# Patient Record
Sex: Male | Born: 1995 | Race: Black or African American | Hispanic: No | Marital: Single | Smoking: Current every day smoker
Health system: Southern US, Community
[De-identification: ages and names within clinical notes are randomized; demographics above are authoritative.]

---
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
## 2020-01-06 ENCOUNTER — Emergency Department (HOSPITAL_COMMUNITY): Payer: Self-pay

## 2020-01-06 ENCOUNTER — Other Ambulatory Visit: Payer: Self-pay

## 2020-01-06 ENCOUNTER — Emergency Department (HOSPITAL_COMMUNITY)
Admission: EM | Admit: 2020-01-06 | Discharge: 2020-01-06 | Disposition: A | Discharge status: Home / Self Care | Payer: Self-pay | Attending: Emergency Medicine | Admitting: Emergency Medicine

## 2020-01-06 ENCOUNTER — Encounter (HOSPITAL_COMMUNITY): Payer: Self-pay

## 2020-01-06 DIAGNOSIS — Y999 Unspecified external cause status: Secondary | ICD-10-CM | POA: Insufficient documentation

## 2020-01-06 DIAGNOSIS — Z23 Encounter for immunization: Secondary | ICD-10-CM | POA: Insufficient documentation

## 2020-01-06 DIAGNOSIS — W260XXA Contact with knife, initial encounter: Secondary | ICD-10-CM | POA: Insufficient documentation

## 2020-01-06 DIAGNOSIS — S61212A Laceration without foreign body of right middle finger without damage to nail, initial encounter: Secondary | ICD-10-CM | POA: Insufficient documentation

## 2020-01-06 DIAGNOSIS — F17210 Nicotine dependence, cigarettes, uncomplicated: Secondary | ICD-10-CM | POA: Insufficient documentation

## 2020-01-06 DIAGNOSIS — S61411A Laceration without foreign body of right hand, initial encounter: Secondary | ICD-10-CM

## 2020-01-06 DIAGNOSIS — Y92009 Unspecified place in unspecified non-institutional (private) residence as the place of occurrence of the external cause: Secondary | ICD-10-CM | POA: Insufficient documentation

## 2020-01-06 DIAGNOSIS — S31113A Laceration without foreign body of abdominal wall, right lower quadrant without penetration into peritoneal cavity, initial encounter: Secondary | ICD-10-CM | POA: Insufficient documentation

## 2020-01-06 DIAGNOSIS — Y9389 Activity, other specified: Secondary | ICD-10-CM | POA: Insufficient documentation

## 2020-01-06 DIAGNOSIS — S61214A Laceration without foreign body of right ring finger without damage to nail, initial encounter: Secondary | ICD-10-CM | POA: Insufficient documentation

## 2020-01-06 DIAGNOSIS — S31613A Laceration without foreign body of abdominal wall, right lower quadrant with penetration into peritoneal cavity, initial encounter: Secondary | ICD-10-CM

## 2020-01-06 LAB — CBC WITH DIFFERENTIAL/PLATELET
Abs Immature Granulocytes: 0.01 10*3/uL (ref 0.00–0.07)
Basophils Absolute: 0 10*3/uL (ref 0.0–0.1)
Basophils Relative: 0 %
Eosinophils Absolute: 0.1 10*3/uL (ref 0.0–0.5)
Eosinophils Relative: 2 %
HCT: 49.7 % (ref 39.0–52.0)
Hemoglobin: 16.7 g/dL (ref 13.0–17.0)
Immature Granulocytes: 0 %
Lymphocytes Relative: 34 %
Lymphs Abs: 1.7 10*3/uL (ref 0.7–4.0)
MCH: 31.4 pg (ref 26.0–34.0)
MCHC: 33.6 g/dL (ref 30.0–36.0)
MCV: 93.4 fL (ref 80.0–100.0)
Monocytes Absolute: 0.5 10*3/uL (ref 0.1–1.0)
Monocytes Relative: 9 %
Neutro Abs: 2.7 10*3/uL (ref 1.7–7.7)
Neutrophils Relative %: 55 %
Platelets: 255 10*3/uL (ref 150–400)
RBC: 5.32 MIL/uL (ref 4.22–5.81)
RDW: 12.1 % (ref 11.5–15.5)
WBC: 4.9 10*3/uL (ref 4.0–10.5)
nRBC: 0 % (ref 0.0–0.2)

## 2020-01-06 LAB — COMPREHENSIVE METABOLIC PANEL
ALT: 30 U/L (ref 0–44)
AST: 33 U/L (ref 15–41)
Albumin: 4.4 g/dL (ref 3.5–5.0)
Alkaline Phosphatase: 61 U/L (ref 38–126)
Anion gap: 8 (ref 5–15)
BUN: 10 mg/dL (ref 6–20)
CO2: 25 mmol/L (ref 22–32)
Calcium: 8.6 mg/dL — ABNORMAL LOW (ref 8.9–10.3)
Chloride: 102 mmol/L (ref 98–111)
Creatinine, Ser: 0.86 mg/dL (ref 0.61–1.24)
GFR calc Af Amer: >60 mL/min (ref >60)
GFR calc non Af Amer: >60 mL/min (ref >60)
Glucose, Bld: 104 mg/dL — ABNORMAL HIGH (ref 70–99)
Potassium: 5.2 mmol/L — ABNORMAL HIGH (ref 3.5–5.1)
Sodium: 135 mmol/L (ref 135–145)
Total Bilirubin: 0.8 mg/dL (ref 0.3–1.2)
Total Protein: 7.7 g/dL (ref 6.5–8.1)

## 2020-01-06 LAB — LIPASE, BLOOD: Lipase: 28 U/L (ref 11–51)

## 2020-01-06 MED ORDER — IBUPROFEN 200 MG PO TABS
600.0000 mg | ORAL_TABLET | Freq: Once | ORAL | Status: AC
  Administered 2020-01-06: 600 mg via ORAL
  Filled 2020-01-06: qty 3

## 2020-01-06 MED ORDER — SODIUM CHLORIDE (PF) 0.9 % IJ SOLN
INTRAMUSCULAR | Status: AC
  Filled 2020-01-06: qty 50

## 2020-01-06 MED ORDER — LIDOCAINE HCL 2 % IJ SOLN
10.0000 mL | Freq: Once | INTRAMUSCULAR | Status: DC
  Filled 2020-01-06: qty 20

## 2020-01-06 MED ORDER — IOHEXOL 300 MG/ML  SOLN
100.0000 mL | Freq: Once | INTRAMUSCULAR | Status: AC | PRN
  Administered 2020-01-06: 100 mL via INTRAVENOUS

## 2020-01-06 MED ORDER — TETANUS-DIPHTH-ACELL PERTUSSIS 5-2.5-18.5 LF-MCG/0.5 IM SUSP
0.5000 mL | Freq: Once | INTRAMUSCULAR | Status: AC
Start: 2020-01-06 — End: 2020-01-06
  Administered 2020-01-06: 0.5 mL via INTRAMUSCULAR
  Filled 2020-01-06: qty 0.5

## 2020-01-06 NOTE — Progress Notes (Signed)
Orthopedic Tech Progress Note Patient Details:  Eric Haney 29-Sep-1995 [id number]  Ortho Devices Type of Ortho Device: Finger splint Ortho Device/Splint Location: right Ortho Device/Splint Interventions: Application   Post Interventions Patient Tolerated: Well Instructions Provided: Care of device   Saul Fordyce 01/06/2020, 1:45 PM

## 2020-01-06 NOTE — ED Triage Notes (Signed)
Patient states he was playing with his dog this morning and had an open knife in his hand. Patient states the knife dropped on the floor.  Patient has a cut to the abdomen and right middle and ring finger. Bleeding controlled.

## 2020-01-06 NOTE — Discharge Instructions (Addendum)
Please follow-up for suture removal at either urgent care ,the emergency department, or your primary care doctor in 7-10 days.  Please return to the emergency room immediately if you experience any new or worsening symptoms or any symptoms that indicate worsening infection such as fevers, increased redness/swelling/pain, warmth, or drainage from the affected area.   

## 2020-01-06 NOTE — ED Provider Notes (Signed)
[facility] DEPT Provider Note   CSN: [id number] Arrival date & time: 01/06/20  8299     History Chief Complaint  Patient presents with  . Laceration    Eric Haney is a 24 y.o. male.  HPI   Patient is a 24 year old male who presents to the emergency department today for evaluation of multiple lacerations.  States he was playing with his dog when he fell and landed on a knife.  States the blade of the knife was about 6 inches long.  He has lacerations to his right forearm, right hand and to his abdomen.  He is not sure how deep the knife went into his abdomen.  He describes minimal pain to the abdomen but has more severe pain to the right hand.  Denies any sensory changes to the right hand.  He is not sure when his last tetanus shot was.  History reviewed. No pertinent past medical history.  There are no problems to display for this patient.   History reviewed. No pertinent surgical history.     History reviewed. No pertinent family history.  Social History   Tobacco Use  . Smoking status: Current Every Day Smoker    Packs/day: 0.25    Types: Cigarettes  . Smokeless tobacco: Never Used  Vaping Use  . Vaping Use: Never used  Substance Use Topics  . Alcohol use: Yes  . Drug use: Never    Home Medications Prior to Admission medications   Not on File    Allergies    Patient has no known allergies.  Review of Systems   Review of Systems  Constitutional: Negative for fever.  HENT: Negative for ear pain.   Eyes: Negative for visual disturbance.  Respiratory: Negative for shortness of breath.   Cardiovascular: Negative for chest pain.  Gastrointestinal: Positive for abdominal pain. Negative for nausea and vomiting.  Genitourinary: Negative for flank pain.  Musculoskeletal:       Right hand pain  Skin: Positive for wound.  Neurological: Negative for weakness, numbness and headaches.  All other systems reviewed and are  negative.   Physical Exam Updated Vital Signs BP (!) 144/90 (BP Location: Left Arm)   Pulse 83   Temp 98 F (36.7 C) (Oral)   Resp 18   Ht 5\' 11"  (1.803 m)   Wt 132.5 kg   SpO2 96%   BMI 40.73 kg/m   Physical Exam Constitutional:      General: He is not in acute distress.    Appearance: He is well-developed.  Eyes:     Conjunctiva/sclera: Conjunctivae normal.  Cardiovascular:     Rate and Rhythm: Normal rate and regular rhythm.  Pulmonary:     Effort: Pulmonary effort is normal.     Breath sounds: Normal breath sounds.  Abdominal:     Palpations: Abdomen is soft.     Comments: 2cm laceration to the abdomen with fatty tissue visualized. Base of wound unable to be visualized. Mild TTP noted to the left mid abdomen. Mild amount of bleeding noted.   Skin:    General: Skin is warm and dry.     Comments: Two 1.5 cm linear lacerations over the dorsum of the right hand. Distal sensation and perfusion intact. Strength intact with flexion/extension of each of the fingers. 3cm superficial laceration to the right forearm  Neurological:     Mental Status: He is alert and oriented to person, place, and time.  ED Results / Procedures / Treatments   Labs (all labs ordered are listed, but only abnormal results are displayed) Labs Reviewed  COMPREHENSIVE METABOLIC PANEL - Abnormal; Notable for the following components:      Result Value   Potassium 5.2 (*)    Glucose, Bld 104 (*)    Calcium 8.6 (*)    All other components within normal limits  CBC WITH DIFFERENTIAL/PLATELET  LIPASE, BLOOD    EKG None  Radiology CT ABDOMEN PELVIS W CONTRAST  Result Date: 01/06/2020 CLINICAL DATA:  Abdominal trauma. Cut abdomen with knife after falling while holding an open blade. EXAM: CT ABDOMEN AND PELVIS WITH CONTRAST TECHNIQUE: Multidetector CT imaging of the abdomen and pelvis was performed using the standard protocol following bolus administration of intravenous contrast.  CONTRAST:  OMNIPAQUE IOHEXOL 300 MG/ML  SOLN COMPARISON:  None. FINDINGS: Lower chest: No acute abnormality. Hepatobiliary: No focal liver abnormality is seen. No gallstones, gallbladder wall thickening, or biliary dilatation. Pancreas: Unremarkable. No pancreatic ductal dilatation or surrounding inflammatory changes. Spleen: Normal in size without focal abnormality. Adrenals/Urinary Tract: Normal appearance of the adrenal glands. The kidneys are unremarkable. No mass or hydronephrosis. Urinary bladder is unremarkable. Stomach/Bowel: Stomach appears normal. No bowel wall thickening, inflammation or distension. The appendix is visualized and appears normal. Vascular/Lymphatic: No significant vascular findings are present. No enlarged abdominal or pelvic lymph nodes. Reproductive: Prostate is unremarkable. Other: There is no free fluid or fluid collections within the abdomen or pelvis. No pneumoperitoneum identified. Musculoskeletal: No acute or significant osseous findings. Bilateral L5 pars defects noted. No significant anterolisthesis. IMPRESSION: 1. No acute findings identified within the abdomen or pelvis. 2. Ventral abdominal wall skin defect compatible with laceration site. No significant hematoma 3. Bilateral L5 pars defects. Electronically Signed   By: Signa Kell M.D.   On: 01/06/2020 12:31   DG Hand Complete Right  Result Date: 01/06/2020 CLINICAL DATA:  Right third finger pain after injury playing with dog this morning EXAM: RIGHT HAND - COMPLETE 3+ VIEW COMPARISON:  None. FINDINGS: No fracture or dislocation. No radiopaque foreign bodies. No focal osseous lesions or significant arthropathy. IMPRESSION: No fracture or malalignment.  No radiopaque foreign body. Electronically Signed   By: Delbert Phenix M.D.   On: 01/06/2020 11:14    Procedures .Marland KitchenLaceration Repair  Date/Time: 01/06/2020 1:30 PM Performed by: Karrie Meres, PA-C Authorized by: Karrie Meres, PA-C   Consent:     Consent obtained:  Verbal   Consent given by:  Patient   Risks discussed:  Pain, infection and need for additional repair   Alternatives discussed:  No treatment Anesthesia (see MAR for exact dosages):    Anesthesia method:  Nerve block   Block location:  3rd digit   Block needle gauge:  27 G   Block anesthetic:  Lidocaine 2% w/o epi   Block injection procedure:  Anatomic landmarks identified, introduced needle, incremental injection, negative aspiration for blood and anatomic landmarks palpated   Block outcome:  Anesthesia achieved Laceration details:    Location:  Finger   Finger location:  R long finger   Length (cm):  1.5 Repair type:    Repair type:  Simple Pre-procedure details:    Preparation:  Patient was prepped and draped in usual sterile fashion and imaging obtained to evaluate for foreign bodies Exploration:    Hemostasis achieved with:  Direct pressure   Wound exploration: wound explored through full range of motion and entire depth of wound probed and visualized  Wound extent: no foreign bodies/material noted, no nerve damage noted, no tendon damage noted, no underlying fracture noted and no vascular damage noted     Contaminated: no   Treatment:    Area cleansed with:  Betadine and saline   Amount of cleaning:  Extensive   Irrigation solution:  Sterile saline   Irrigation volume:  500cc   Irrigation method:  Pressure wash   Visualized foreign bodies/material removed: no   Skin repair:    Repair method:  Sutures   Suture size:  5-0   Suture material:  Prolene   Suture technique:  Simple interrupted   Number of sutures:  5 Approximation:    Approximation:  Close Post-procedure details:    Dressing:  Splint for protection   Patient tolerance of procedure:  Tolerated well, no immediate complications .Marland KitchenLaceration Repair  Date/Time: 01/06/2020 1:32 PM Performed by: Karrie Meres, PA-C Authorized by: Karrie Meres, PA-C   Consent:    Consent  obtained:  Verbal   Consent given by:  Patient   Risks discussed:  Infection, pain and need for additional repair   Alternatives discussed:  No treatment Anesthesia (see MAR for exact dosages):    Anesthesia method:  Nerve block   Block needle gauge:  27 G   Block anesthetic:  Lidocaine 2% w/o epi   Block injection procedure:  Anatomic landmarks identified, introduced needle, incremental injection, negative aspiration for blood and anatomic landmarks palpated   Block outcome:  Anesthesia achieved Laceration details:    Location:  Finger   Finger location:  R ring finger   Length (cm):  1.5 Repair type:    Repair type:  Simple Pre-procedure details:    Preparation:  Patient was prepped and draped in usual sterile fashion and imaging obtained to evaluate for foreign bodies Exploration:    Hemostasis achieved with:  Direct pressure   Wound exploration: wound explored through full range of motion and entire depth of wound probed and visualized     Wound extent: no foreign bodies/material noted, no nerve damage noted, no tendon damage noted, no underlying fracture noted and no vascular damage noted     Contaminated: no   Treatment:    Area cleansed with:  Betadine and saline   Amount of cleaning:  Extensive   Irrigation solution:  Sterile saline   Irrigation volume:  500cc   Irrigation method:  Pressure wash   Visualized foreign bodies/material removed: no   Skin repair:    Repair method:  Sutures   Suture size:  5-0   Suture material:  Prolene   Suture technique:  Simple interrupted   Number of sutures:  3 Approximation:    Approximation:  Close Post-procedure details:    Dressing:  Splint for protection   Patient tolerance of procedure:  Tolerated well, no immediate complications .Marland KitchenLaceration Repair  Date/Time: 01/06/2020 1:32 PM Performed by: Karrie Meres, PA-C Authorized by: Karrie Meres, PA-C   Consent:    Consent obtained:  Verbal   Consent given by:   Patient   Risks discussed:  Infection, pain and need for additional repair   Alternatives discussed:  No treatment Anesthesia (see MAR for exact dosages):    Anesthesia method:  Local infiltration   Local anesthetic:  Lidocaine 2% w/o epi Laceration details:    Location:  Trunk   Trunk location:  RLQ abd   Length (cm):  2 Repair type:    Repair type:  Simple Pre-procedure details:  Preparation:  Patient was prepped and draped in usual sterile fashion and imaging obtained to evaluate for foreign bodies Exploration:    Hemostasis achieved with:  Direct pressure   Wound exploration: wound explored through full range of motion and entire depth of wound probed and visualized     Wound extent: no fascia violation noted, no foreign bodies/material noted, no nerve damage noted, no tendon damage noted, no underlying fracture noted and no vascular damage noted     Contaminated: no   Treatment:    Area cleansed with:  Betadine and saline   Amount of cleaning:  Extensive   Irrigation solution:  Sterile saline   Irrigation volume:  500cc   Irrigation method:  Pressure wash   Visualized foreign bodies/material removed: no   Skin repair:    Repair method:  Sutures   Suture size:  4-0   Suture material:  Prolene   Suture technique:  Simple interrupted   Number of sutures:  6 Approximation:    Approximation:  Close Post-procedure details:    Dressing:  Non-adherent dressing   Patient tolerance of procedure:  Tolerated well, no immediate complications   (including critical care time)  Medications Ordered in ED Medications  lidocaine (XYLOCAINE) 2 % (with pres) injection 200 mg (has no administration in time range)  sodium chloride (PF) 0.9 % injection (has no administration in time range)  ibuprofen (ADVIL) tablet 600 mg (600 mg Oral Given 01/06/20 1157)  Tdap (BOOSTRIX) injection 0.5 mL (0.5 mLs Intramuscular Given 01/06/20 1156)  iohexol (OMNIPAQUE) 300 MG/ML solution 100 mL (100 mLs  Intravenous Contrast Given 01/06/20 1143)    ED Course  I have reviewed the triage vital signs and the nursing notes.  Pertinent labs & imaging results that were available during my care of the patient were reviewed by me and considered in my medical decision making (see chart for details).    MDM Rules/Calculators/A&P                         24 y/o male presenting with puncture wound to the abdomen and lacerations to the hand and right forearm  CBC nonacute CMP with mildly elevated k, otherwise reasuring Lipase normal  Xray right hand neg for fx  CT abd/pelvis w/o acute traumatic injury within the abd/pelvis  Pressure irrigation performed. Wound explored and base of wound visualized in a bloodless field without evidence of foreign body.  Laceration occurred < 8 hours prior to repair which was well tolerated.  Tdap updated.  Pt has  no comorbidities to effect normal wound healing. Pt discharged  without antibiotics.  Discussed suture home care with patient and answered questions. Pt to follow-up for wound check and suture removal in 10-14 days; they are to return to the ED sooner for signs of infection. Pt is hemodynamically stable with no complaints prior to dc.    Final Clinical Impression(s) / ED Diagnoses Final diagnoses:  Laceration of right hand, foreign body presence unspecified, initial encounter  Lacerat of RLQ of abdomin wall w/o FB w/penentrat into periton cavity, initial encounter    Rx / DC Orders ED Discharge Orders    None       Rayne Du 01/06/20 1334    Linwood Dibbles, MD 01/07/20 [phone]

## 2020-01-20 ENCOUNTER — Other Ambulatory Visit: Payer: Self-pay

## 2020-01-20 ENCOUNTER — Encounter (HOSPITAL_COMMUNITY): Payer: Self-pay | Admitting: Emergency Medicine

## 2020-01-20 ENCOUNTER — Emergency Department (HOSPITAL_COMMUNITY)
Admission: EM | Admit: 2020-01-20 | Discharge: 2020-01-20 | Disposition: A | Discharge status: Home / Self Care | Payer: Self-pay | Attending: Emergency Medicine | Admitting: Emergency Medicine

## 2020-01-20 DIAGNOSIS — Z4802 Encounter for removal of sutures: Secondary | ICD-10-CM | POA: Insufficient documentation

## 2020-01-20 NOTE — ED Triage Notes (Signed)
Per pt, states needs stitches removed from right pointer and ring finger

## 2020-01-20 NOTE — Discharge Instructions (Signed)
Please follow up with your primary care provider within 5-7 days for re-evaluation of your symptoms. If you do not have a primary care provider, information for a healthcare clinic has been provided for you to make arrangements for follow up care. Please return to the emergency department for any new or worsening symptoms. ° °

## 2020-01-20 NOTE — ED Provider Notes (Signed)
[facility]-EMERGENCY DEPT Provider Note   CSN: [id number] Arrival date & time: 01/20/20  1302     History Chief Complaint  Patient presents with  . Suture / Staple Removal    Eric Haney is a 24 y.o. male.  HPI   24 year old male presenting for evaluation for suture removal.  He had sutures placed on 01/06/2020 to the right third and fourth digit in the abdomen.  States wounds are healing well.  No fevers, increased redness, pain or swelling to the wound  History reviewed. No pertinent past medical history.  There are no problems to display for this patient.   History reviewed. No pertinent surgical history.     No family history on file.  Social History   Tobacco Use  . Smoking status: Current Every Day Smoker    Packs/day: 0.25    Types: Cigarettes  . Smokeless tobacco: Never Used  Vaping Use  . Vaping Use: Never used  Substance Use Topics  . Alcohol use: Yes  . Drug use: Never    Home Medications Prior to Admission medications   Not on File    Allergies    Patient has no known allergies.  Review of Systems   Review of Systems  Constitutional: Negative for fever.  Respiratory: Negative for shortness of breath.   Cardiovascular: Negative for chest pain.  Skin: Positive for wound.    Physical Exam Updated Vital Signs BP (!) 148/94 (BP Location: Right Arm)   Pulse 78   Temp 99.5 F (37.5 C) (Oral)   Resp 18   SpO2 93%   Physical Exam Constitutional:      General: He is not in acute distress.    Appearance: He is well-developed.  Eyes:     Conjunctiva/sclera: Conjunctivae normal.  Cardiovascular:     Rate and Rhythm: Normal rate.  Pulmonary:     Effort: Pulmonary effort is normal.  Musculoskeletal:     Comments: Sutures in place to the third and fourth digits as well as to the abdomen.  Wounds are well-healing without erythema, warmth, induration.  No wound dehiscence.  Skin:    General: Skin is warm and dry.    Neurological:     Mental Status: He is alert and oriented to person, place, and time.     ED Results / Procedures / Treatments   Labs (all labs ordered are listed, but only abnormal results are displayed) Labs Reviewed - No data to display  EKG None  Radiology No results found.  Procedures .Suture Removal  Date/Time: 01/20/2020 1:32 PM Performed by: Karrie Meres, PA-C Authorized by: Karrie Meres, PA-C   Consent:    Consent obtained:  Verbal   Consent given by:  Patient   Risks discussed:  Bleeding   Alternatives discussed:  No treatment Location:    Location:  Upper extremity   Upper extremity location:  Hand   Hand location:  R long finger Procedure details:    Wound appearance:  No signs of infection, nonpurulent and good wound healing   Number of sutures removed:  5 Post-procedure details:    Post-removal:  No dressing applied   Patient tolerance of procedure:  Tolerated well, no immediate complications .Suture Removal  Date/Time: 01/20/2020 1:33 PM Performed by: Karrie Meres, PA-C Authorized by: Karrie Meres, PA-C   Consent:    Consent obtained:  Verbal   Consent given by:  Patient   Risks discussed:  Bleeding and pain  Alternatives discussed:  No treatment Location:    Location:  Upper extremity   Upper extremity location:  Hand   Hand location:  R ring finger Procedure details:    Wound appearance:  No signs of infection and good wound healing   Number of sutures removed:  3 Post-procedure details:    Post-removal:  No dressing applied   Patient tolerance of procedure:  Tolerated well, no immediate complications .Suture Removal  Date/Time: 01/20/2020 1:33 PM Performed by: Karrie Meres, PA-C Authorized by: Karrie Meres, PA-C   Consent:    Consent obtained:  Verbal   Consent given by:  Patient   Risks discussed:  Bleeding and pain   Alternatives discussed:  No treatment Location:    Location:  Trunk   Trunk  location:  Abdomen Procedure details:    Wound appearance:  No signs of infection, good wound healing and clean   Number of sutures removed:  6 Post-procedure details:    Post-removal:  No dressing applied   Patient tolerance of procedure:  Tolerated well, no immediate complications   (including critical care time)  Medications Ordered in ED Medications - No data to display  ED Course  I have reviewed the triage vital signs and the nursing notes.  Pertinent labs & imaging results that were available during my care of the patient were reviewed by me and considered in my medical decision making (see chart for details).    MDM Rules/Calculators/A&P                          Staple removal   Pt to ER for staple/suture removal and wound check as above. Procedure tolerated well. Vitals normal, no signs of infection. Scar minimization & return precautions given at dc.    Final Clinical Impression(s) / ED Diagnoses Final diagnoses:  Visit for suture removal    Rx / DC Orders ED Discharge Orders    None       Rayne Du 01/20/20 1334    Arby Barrette, MD 01/21/20 1032

## 2021-03-25 IMAGING — DX DG HAND COMPLETE 3+V*R*
3 series · 3 of 3 positions shown · non-contrast
Comparison: None.

CLINICAL DATA: Right third finger pain after injury playing with
dog this morning

EXAM:
RIGHT HAND - COMPLETE 3+ VIEW

[hand ap]
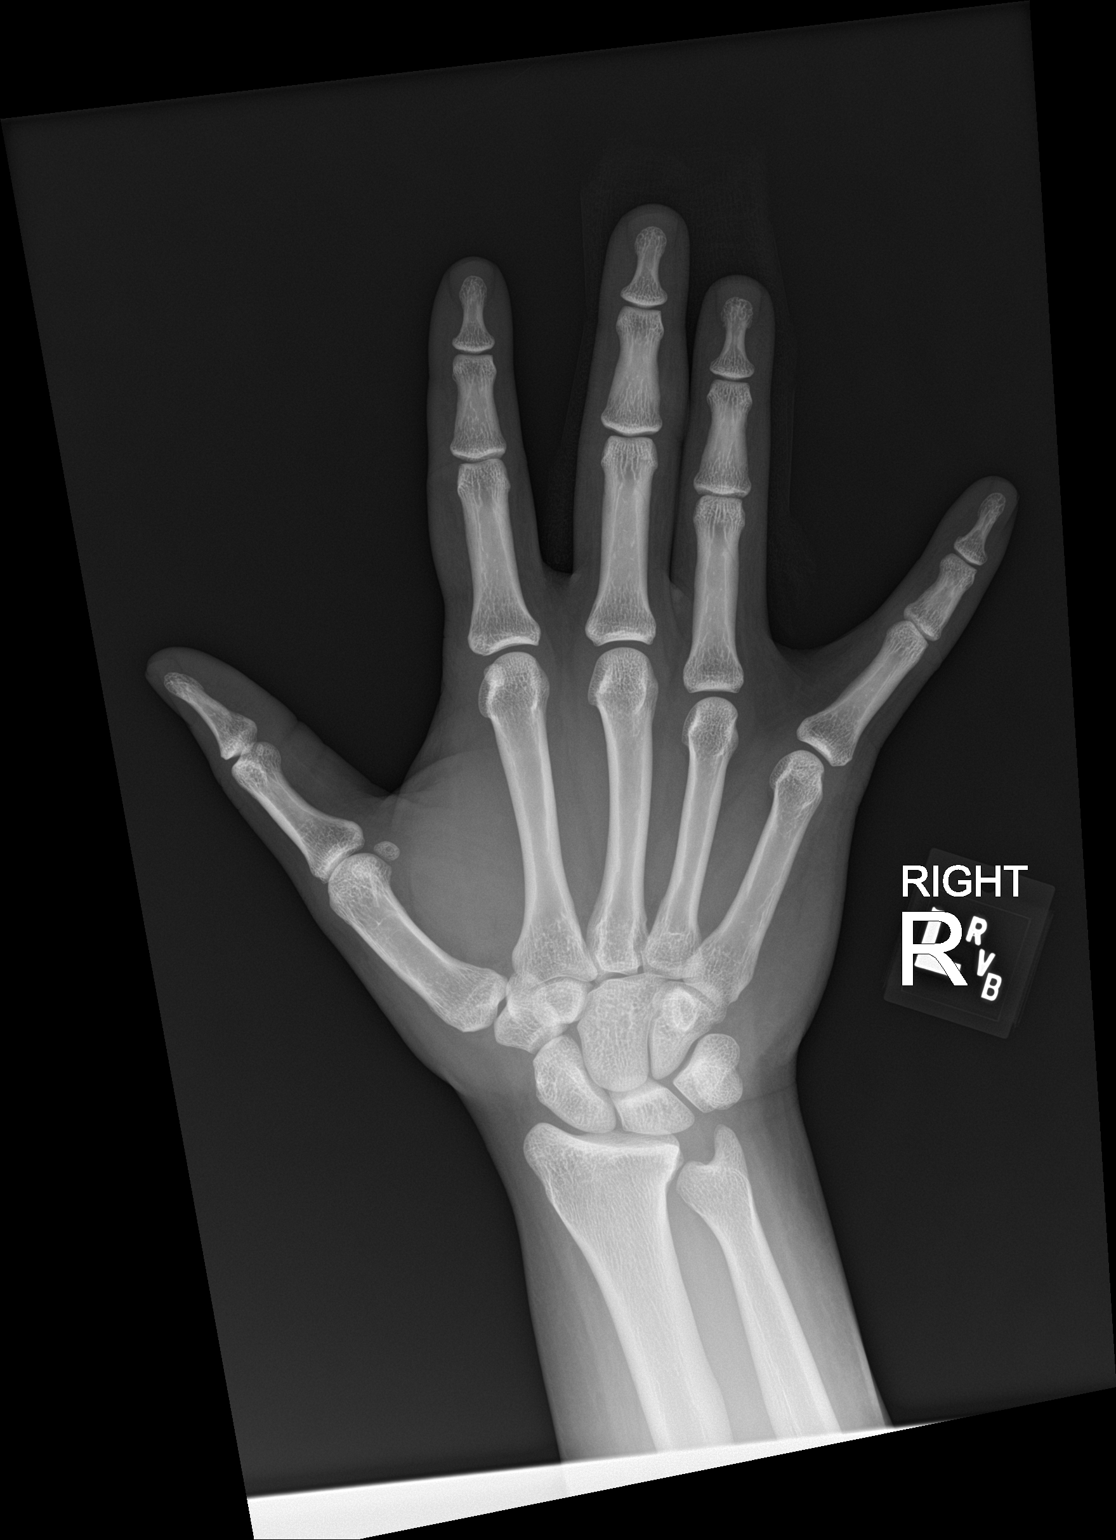

[hand obl]
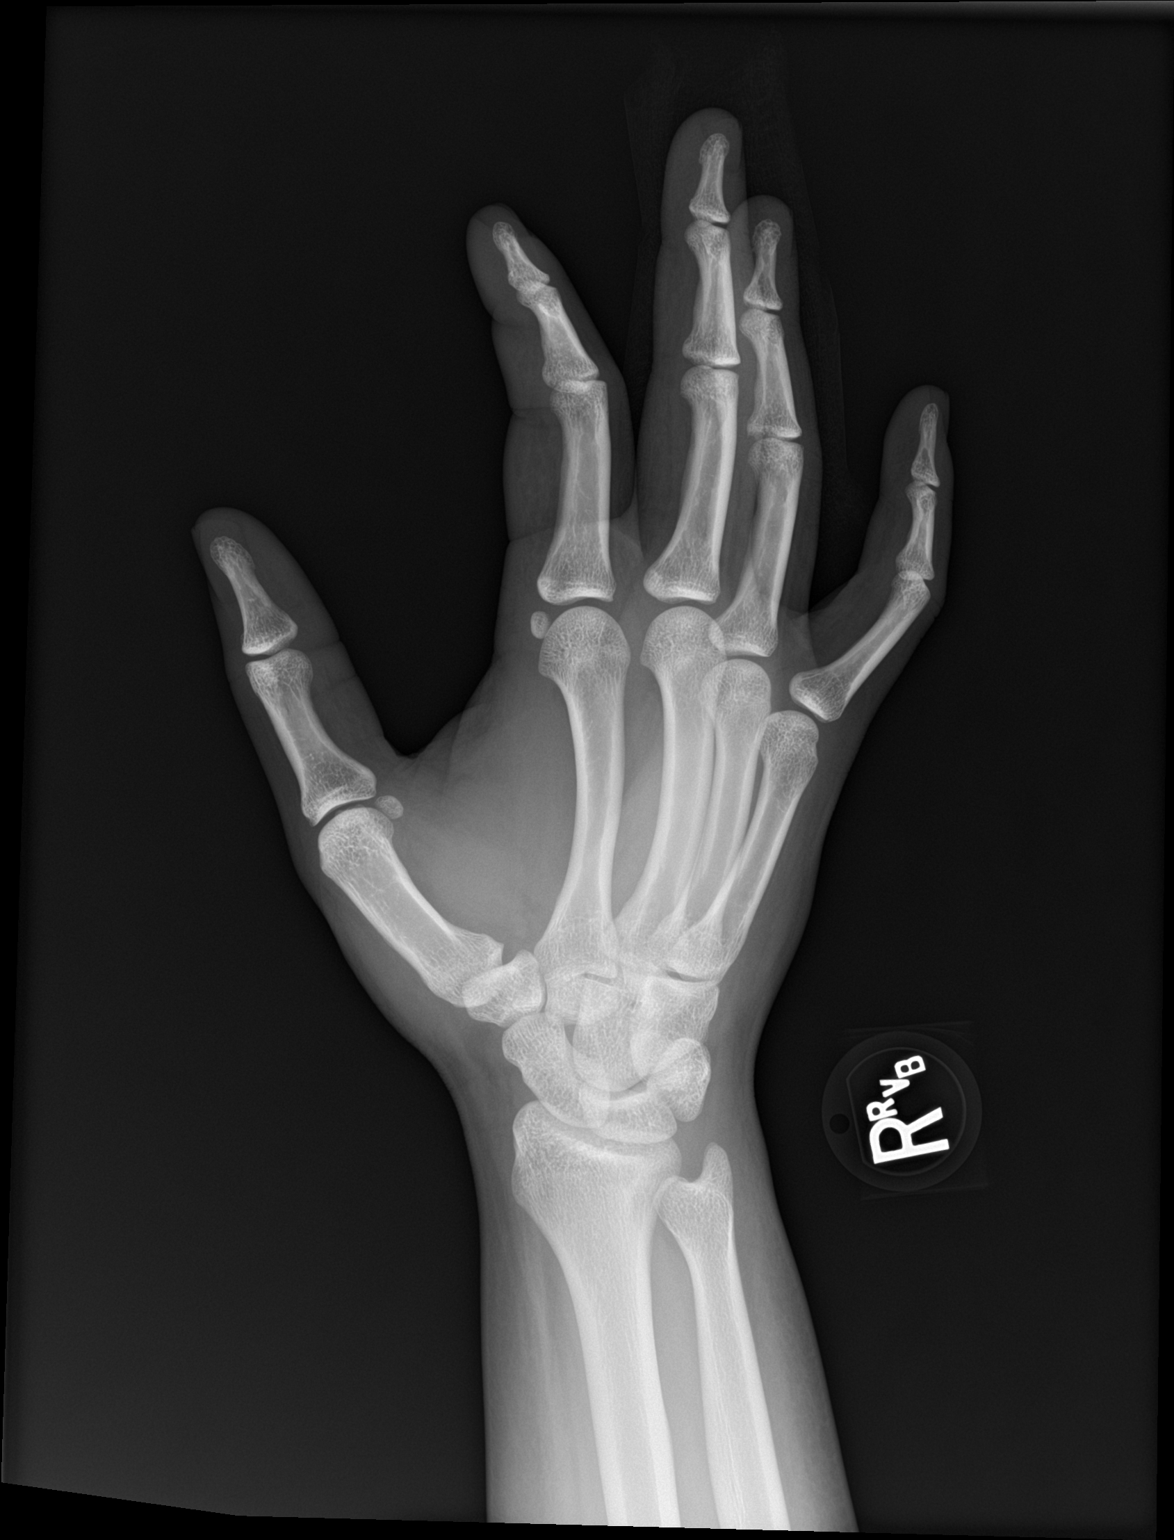

[hand lat]
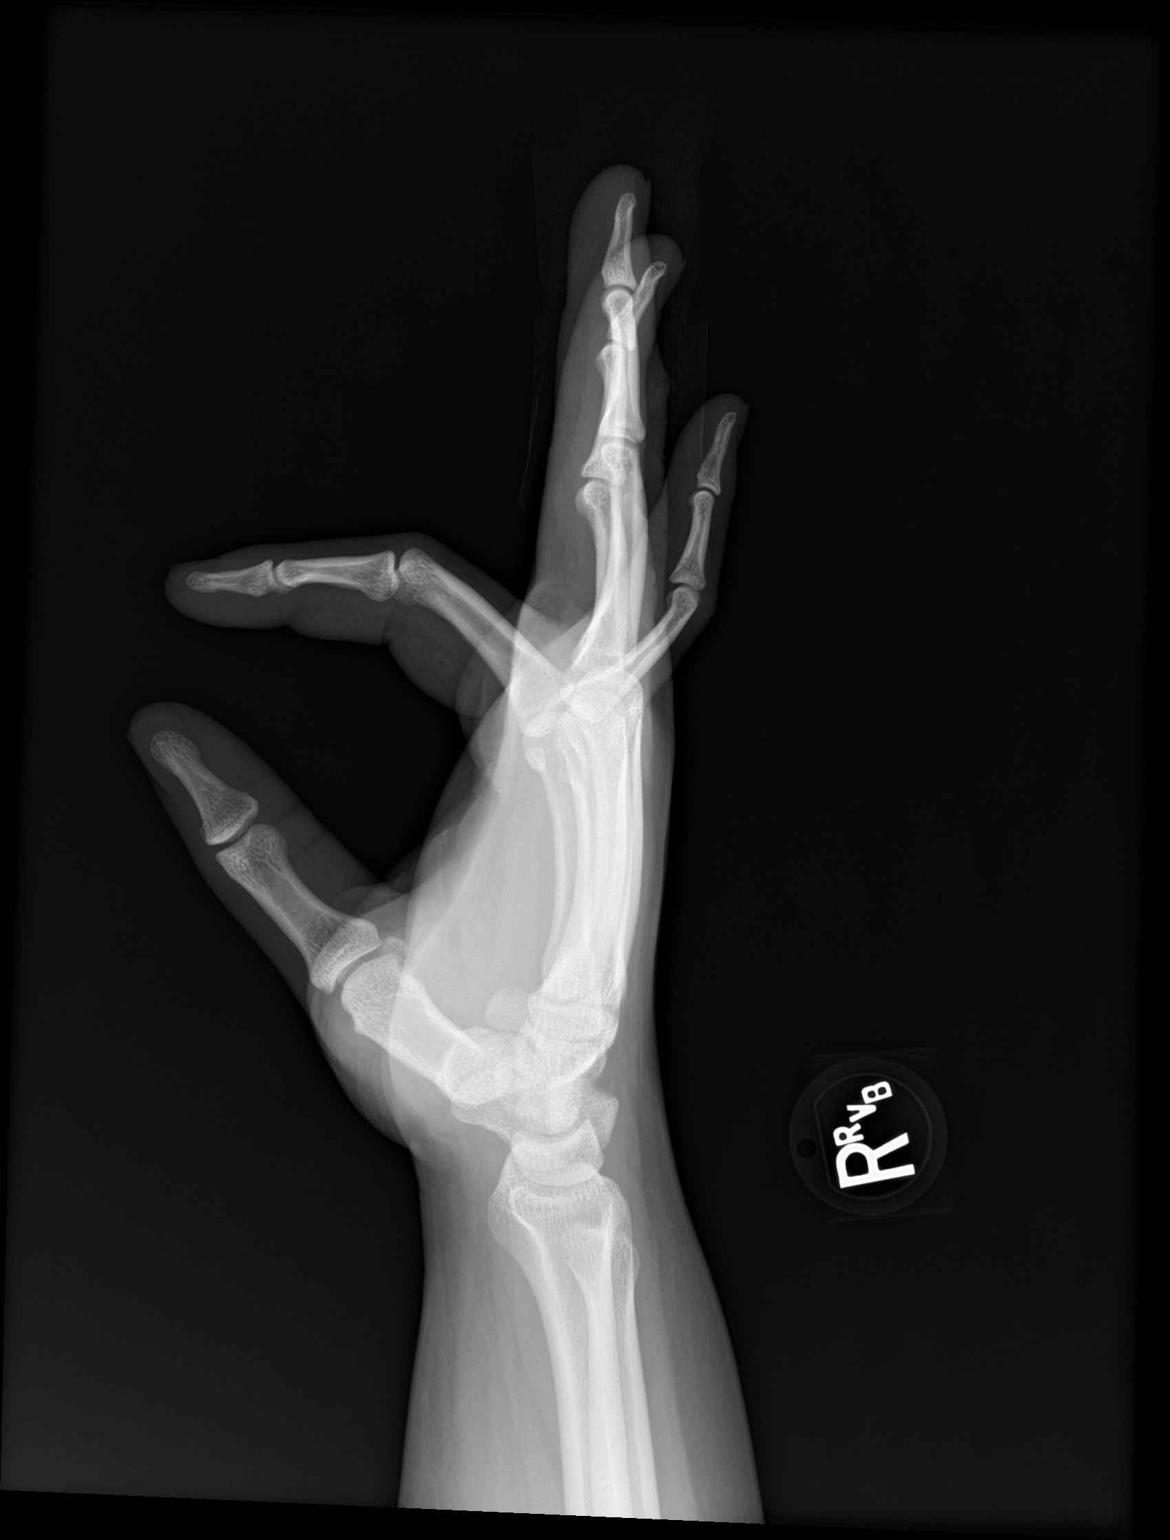

[3 of 3 positions shown; findings below may reference images not displayed]

FINDINGS: No fracture or dislocation. No radiopaque foreign bodies. No focal
osseous lesions or significant arthropathy.
IMPRESSION: No fracture or malalignment.  No radiopaque foreign body.

## 2021-03-25 IMAGING — CT CT ABD-PELV W/ CM
2 of 7 series · 14 of 46 positions shown, 18 images · IV contrast (OMNIPAQUE 300)
Comparison: None.

CLINICAL DATA: Abdominal trauma. Cut abdomen with knife after
falling while holding an open blade.

EXAM:
CT ABDOMEN AND PELVIS WITH CONTRAST
TECHNIQUE: Multidetector CT imaging of the abdomen and pelvis was performed
using the standard protocol following bolus administration of
intravenous contrast.
CONTRAST:  100mL OMNIPAQUE IOHEXOL 300 MG/ML  SOLN

[Series 2: axial st · axial · 0.98mm/px · z∈[-489,-94]mm · 11 of 91 slices shown, 15 images]
[im 6/91  soft-tissue]
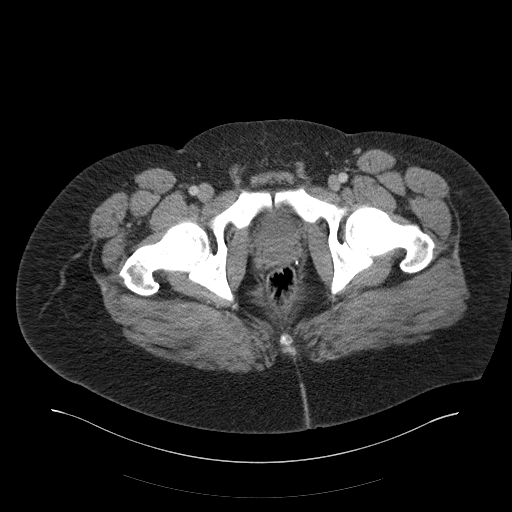
[im 6/91  bone]
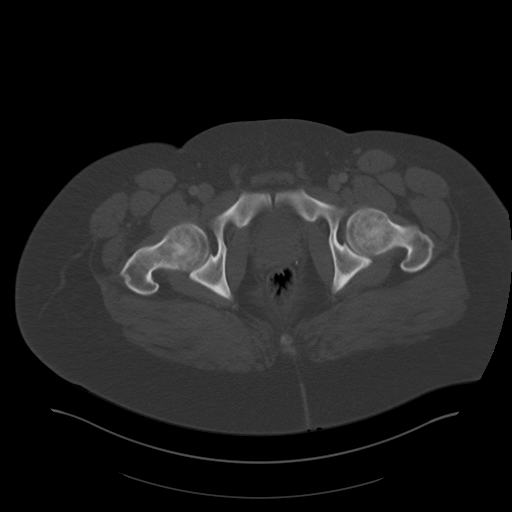
[im 17/91  soft-tissue]
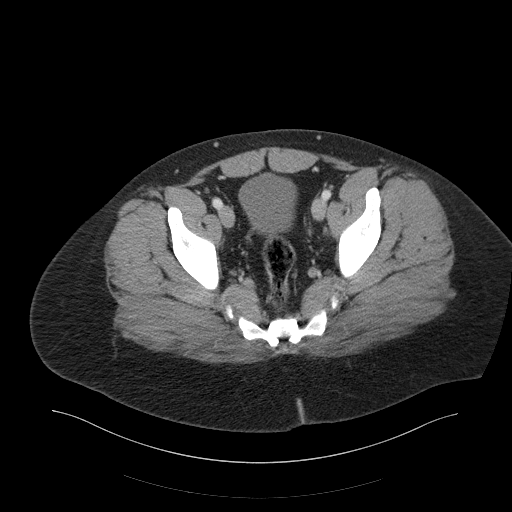
[im 29/91  soft-tissue]
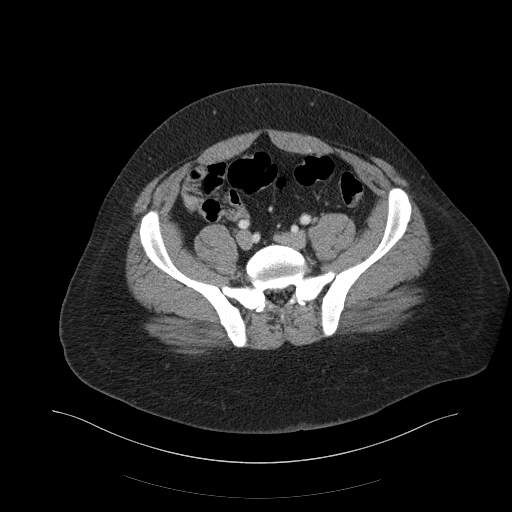
[im 34/91  soft-tissue]
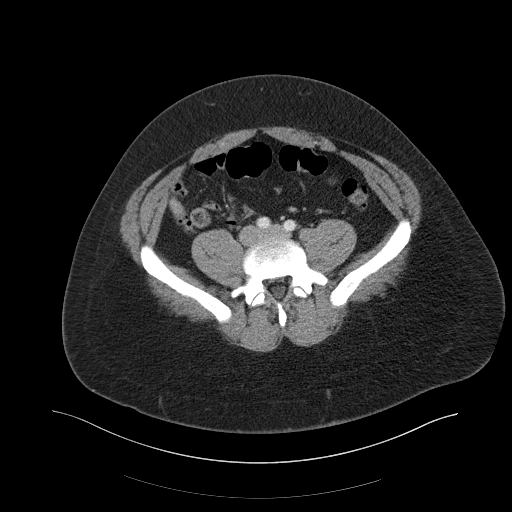
[im 46/91  soft-tissue]
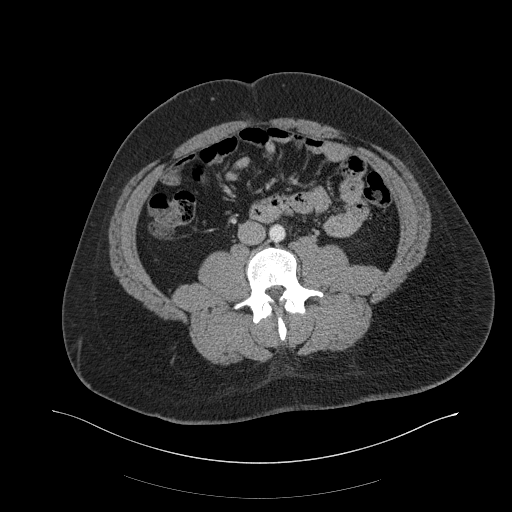
[im 57/91  soft-tissue]
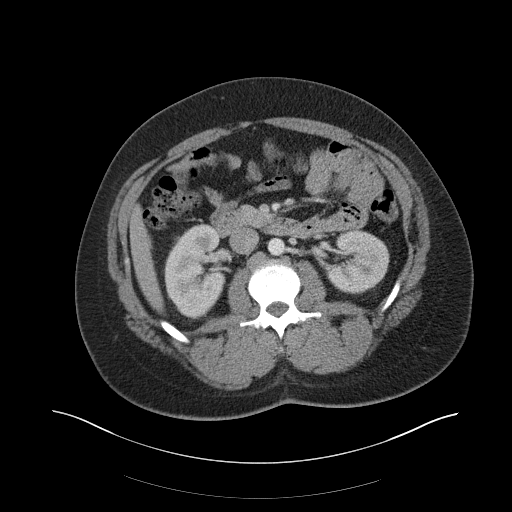
[im 62/91  soft-tissue]
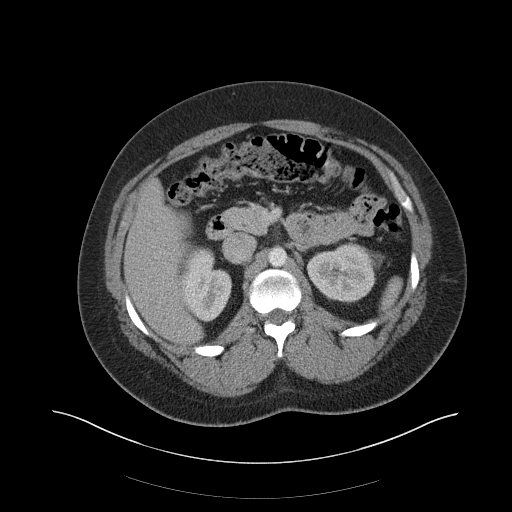
[im 68/91  lung]
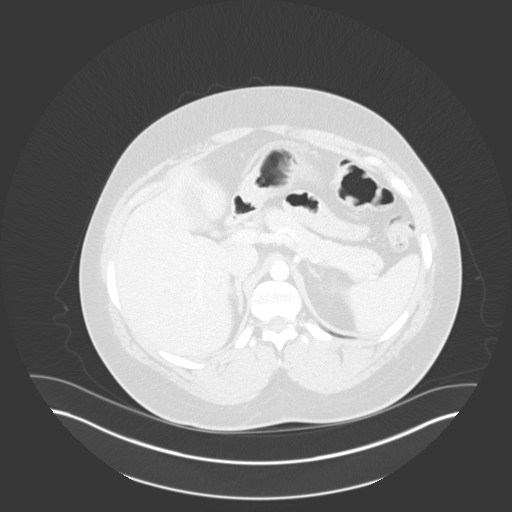
[im 74/91  soft-tissue]
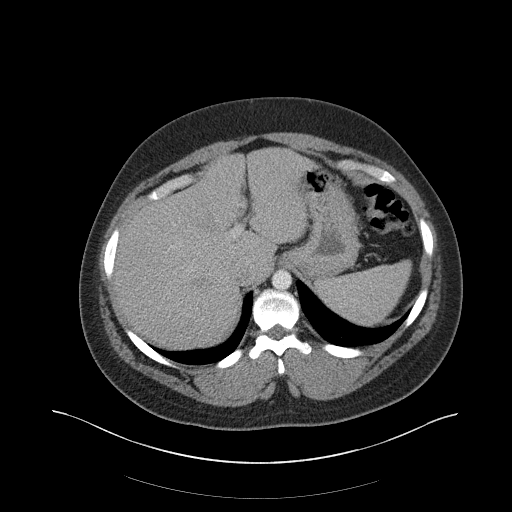
[im 74/91  lung]
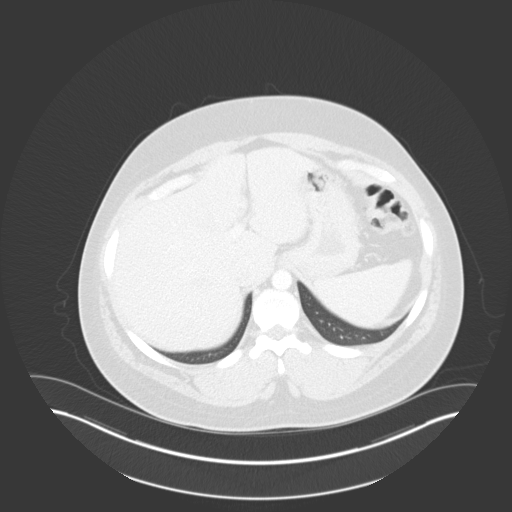
[im 79/91  lung]
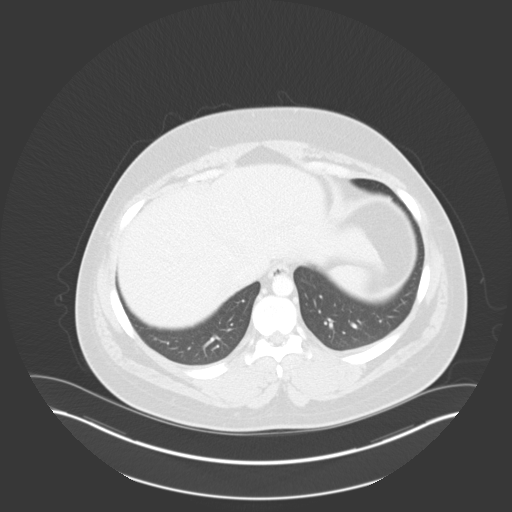
[im 85/91  soft-tissue]
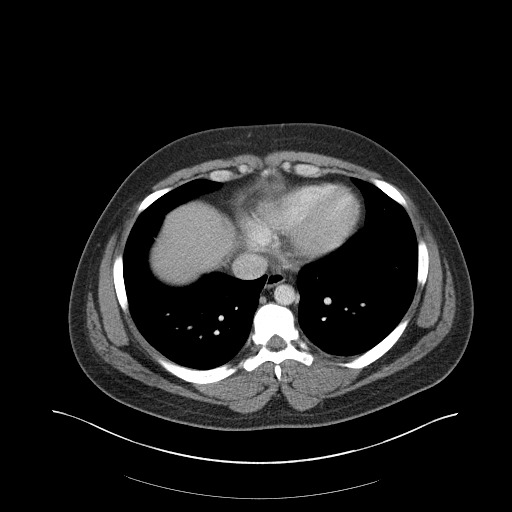
[im 85/91  lung]
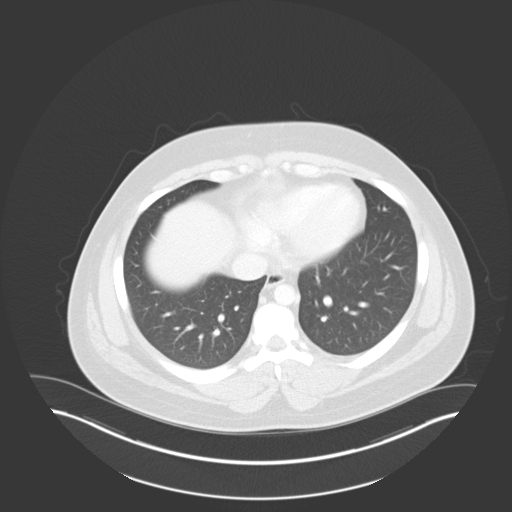
[im 85/91  bone]
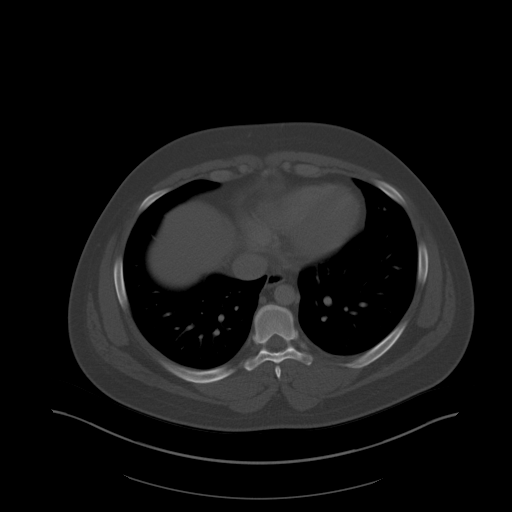

[Series 7: coronal st · coronal · 0.79mm/px · 3 of 163 slices shown]
[im 41/163  soft-tissue]
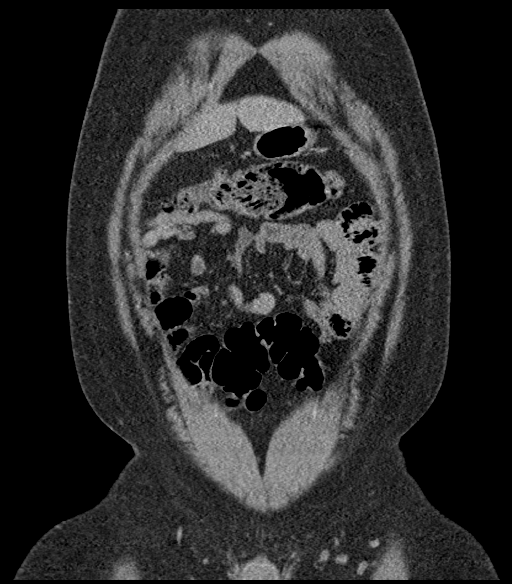
[im 82/163  soft-tissue]
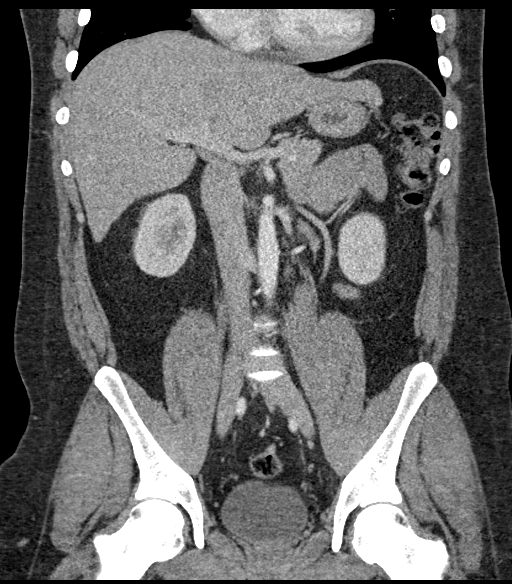
[im 122/163  soft-tissue]
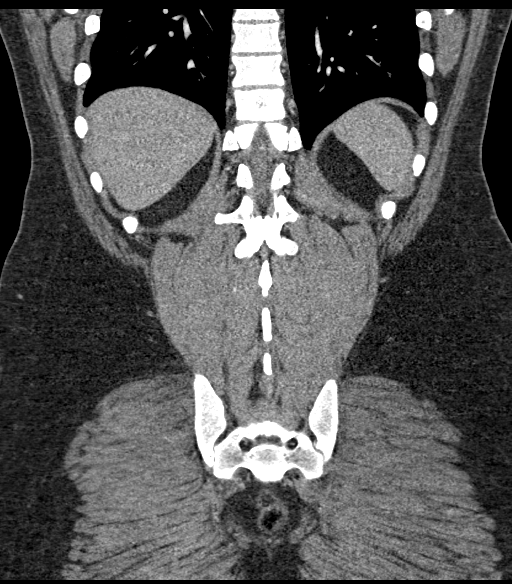

[14 of 46 positions shown; findings below may reference images not displayed]

FINDINGS: Lower chest: No acute abnormality.

Hepatobiliary: No focal liver abnormality is seen. No gallstones,
gallbladder wall thickening, or biliary dilatation.

Pancreas: Unremarkable. No pancreatic ductal dilatation or
surrounding inflammatory changes.

Spleen: Normal in size without focal abnormality.

Adrenals/Urinary Tract: Normal appearance of the adrenal glands. The
kidneys are unremarkable. No mass or hydronephrosis. Urinary bladder
is unremarkable.

Stomach/Bowel: Stomach appears normal. No bowel wall thickening,
inflammation or distension. The appendix is visualized and appears
normal.

Vascular/Lymphatic: No significant vascular findings are present. No
enlarged abdominal or pelvic lymph nodes.

Reproductive: Prostate is unremarkable.

Other: There is no free fluid or fluid collections within the
abdomen or pelvis. No pneumoperitoneum identified.

Musculoskeletal: No acute or significant osseous findings. Bilateral
L5 pars defects noted. No significant anterolisthesis.
IMPRESSION: 1. No acute findings identified within the abdomen or pelvis.
2. Ventral abdominal wall skin defect compatible with laceration
site. No significant hematoma
3. Bilateral L5 pars defects.

## 2022-01-05 ENCOUNTER — Emergency Department (HOSPITAL_COMMUNITY)
Admission: EM | Admit: 2022-01-05 | Discharge: 2022-01-05 | Disposition: A | Discharge status: Home / Self Care | Payer: Self-pay | Attending: Emergency Medicine | Admitting: Emergency Medicine

## 2022-01-05 ENCOUNTER — Other Ambulatory Visit: Payer: Self-pay

## 2022-01-05 ENCOUNTER — Encounter (HOSPITAL_COMMUNITY): Payer: Self-pay

## 2022-01-05 ENCOUNTER — Emergency Department (HOSPITAL_COMMUNITY): Payer: Self-pay

## 2022-01-05 DIAGNOSIS — R1013 Epigastric pain: Secondary | ICD-10-CM | POA: Insufficient documentation

## 2022-01-05 DIAGNOSIS — R630 Anorexia: Secondary | ICD-10-CM | POA: Insufficient documentation

## 2022-01-05 DIAGNOSIS — I10 Essential (primary) hypertension: Secondary | ICD-10-CM | POA: Insufficient documentation

## 2022-01-05 DIAGNOSIS — R112 Nausea with vomiting, unspecified: Secondary | ICD-10-CM | POA: Insufficient documentation

## 2022-01-05 LAB — COMPREHENSIVE METABOLIC PANEL
ALT: 25 U/L (ref 0–44)
AST: 21 U/L (ref 15–41)
Albumin: 4.2 g/dL (ref 3.5–5.0)
Alkaline Phosphatase: 61 U/L (ref 38–126)
Anion gap: 12 (ref 5–15)
BUN: 8 mg/dL (ref 6–20)
CO2: 24 mmol/L (ref 22–32)
Calcium: 9.2 mg/dL (ref 8.9–10.3)
Chloride: 101 mmol/L (ref 98–111)
Creatinine, Ser: 1.08 mg/dL (ref 0.61–1.24)
GFR, Estimated: >60 mL/min (ref >60)
Glucose, Bld: 118 mg/dL — ABNORMAL HIGH (ref 70–99)
Potassium: 3.6 mmol/L (ref 3.5–5.1)
Sodium: 137 mmol/L (ref 135–145)
Total Bilirubin: 1.2 mg/dL (ref 0.3–1.2)
Total Protein: 7.1 g/dL (ref 6.5–8.1)

## 2022-01-05 LAB — CBC
HCT: 49.3 % (ref 39.0–52.0)
Hemoglobin: 17.5 g/dL — ABNORMAL HIGH (ref 13.0–17.0)
MCH: 32.3 pg (ref 26.0–34.0)
MCHC: 35.5 g/dL (ref 30.0–36.0)
MCV: 91.1 fL (ref 80.0–100.0)
Platelets: 257 10*3/uL (ref 150–400)
RBC: 5.41 MIL/uL (ref 4.22–5.81)
RDW: 11.9 % (ref 11.5–15.5)
WBC: 6 10*3/uL (ref 4.0–10.5)
nRBC: 0 % (ref 0.0–0.2)

## 2022-01-05 LAB — URINALYSIS, ROUTINE W REFLEX MICROSCOPIC
Bacteria, UA: NONE SEEN
Bilirubin Urine: NEGATIVE
Glucose, UA: NEGATIVE mg/dL
Hgb urine dipstick: NEGATIVE
Ketones, ur: 20 mg/dL — AB
Leukocytes,Ua: NEGATIVE
Nitrite: NEGATIVE
Protein, ur: 100 mg/dL — AB
Specific Gravity, Urine: 1.034 — ABNORMAL HIGH (ref 1.005–1.030)
pH: 5 (ref 5.0–8.0)

## 2022-01-05 LAB — LIPASE, BLOOD: Lipase: 39 U/L (ref 11–51)

## 2022-01-05 MED ORDER — ONDANSETRON 4 MG PO TBDP: 4.0000 mg | ORAL_TABLET | Freq: Three times a day (TID) | ORAL | 0 refills | Status: AC | PRN

## 2022-01-05 MED ORDER — LIDOCAINE VISCOUS HCL 2 % MT SOLN
15.0000 mL | Freq: Once | OROMUCOSAL | Status: AC
Start: 2022-01-05 — End: 2022-01-05
  Administered 2022-01-05: 15 mL via ORAL
  Filled 2022-01-05: qty 15

## 2022-01-05 MED ORDER — ONDANSETRON 4 MG PO TBDP
4.0000 mg | ORAL_TABLET | Freq: Once | ORAL | Status: AC
  Administered 2022-01-05: 4 mg via ORAL
  Filled 2022-01-05: qty 1

## 2022-01-05 MED ORDER — PANTOPRAZOLE SODIUM 20 MG PO TBEC: 20.0000 mg | DELAYED_RELEASE_TABLET | Freq: Every day | ORAL | 0 refills | Status: AC

## 2022-01-05 MED ORDER — SUCRALFATE 1 G PO TABS: 1.0000 g | ORAL_TABLET | Freq: Three times a day (TID) | ORAL | 0 refills | Status: AC

## 2022-01-05 MED ORDER — ALUM & MAG HYDROXIDE-SIMETH 200-200-20 MG/5ML PO SUSP
30.0000 mL | Freq: Once | ORAL | Status: AC
  Administered 2022-01-05: 30 mL via ORAL
  Filled 2022-01-05: qty 30

## 2022-01-05 NOTE — Discharge Instructions (Addendum)
You came to the emergency department today to be evaluated for your abdominal pain.  The ultrasound of your right upper quadrant did not show any problems with your gallbladder or liver.  Your lab results were reassuring.  I have given you prescriptions for medications to help with your abdominal pain.  Please follow-up closely with the gastroenterologist listed on this paperwork.  While in the emergency department your blood pressure was found to be elevated.  Please follow-up with [facility] for further management of your blood pressure.  Get help right away if: Your pain does not go away as soon as your health care provider told you to expect. You cannot stop vomiting. Your pain is only in areas of the abdomen, such as the right side or the left lower portion of the abdomen. Pain on the right side could be caused by appendicitis. You have bloody or black stools, or stools that look like tar. You have severe pain, cramping, or bloating in your abdomen. You have signs of dehydration, such as: Dark urine, very little urine, or no urine. Cracked lips. Dry mouth. Sunken eyes. Sleepiness. Weakness. You have trouble breathing or chest pain.

## 2022-01-05 NOTE — ED Provider Notes (Signed)
[facility] EMERGENCY DEPARTMENT Provider Note   CSN: [id number] Arrival date & time: 01/05/22  7253     History  No chief complaint on file.   Eric Haney is a 26 y.o. male with a history of alcohol use.  Presents to the emergency department with a chief complaint of abdominal pain.  Patient reports that he started having epigastric abdominal pain on Thursday.  Patient did have some improvement in his pain yesterday however after returning from work the pain became worse and has been constant since then.  Patient reports that he has vomited approximately 20 times in the last 24 hours.  Patient describes emesis as yellow with minimal amounts of bright red streaks of blood.  Patient reports that due to his pain he has had decreased p.o. intake over the last 3 days.  Denies any fever, chills, constipation, diarrhea, blood in stool, melena, coffee-ground emesis, dysuria, hematuria, urinary urgency, swelling or tenderness genitals, genital sores or lesions, penile discharge, chest pain, shortness of breath, lightheadedness, syncope.  Patient denies any illicit drug use.  Patient does endorse alcohol use states that he drinks approximately 1/5 of liquor every day.  Patient has not had any alcohol since Thursday.  Denies any history of abdominal surgeries.  HPI     Home Medications Prior to Admission medications   Not on File      Allergies    Patient has no known allergies.    Review of Systems   Review of Systems  Constitutional:  Negative for chills and fever.  Respiratory:  Negative for shortness of breath.   Cardiovascular:  Negative for chest pain.  Gastrointestinal:  Positive for abdominal pain, nausea and vomiting. Negative for abdominal distention, anal bleeding, blood in stool, constipation, diarrhea and rectal pain.  Genitourinary:  Negative for difficulty urinating, dysuria, flank pain, frequency, genital sores, hematuria, penile discharge, penile pain,  penile swelling, scrotal swelling, testicular pain and urgency.  Musculoskeletal:  Negative for back pain and neck pain.  Skin:  Negative for color change and rash.  Neurological:  Negative for dizziness, syncope, light-headedness and headaches.  Psychiatric/Behavioral:  Negative for confusion.     Physical Exam Updated Vital Signs BP (!) 140/98 (BP Location: Right Arm)   Pulse 87   Temp 98.3 F (36.8 C) (Oral)   Resp 16   SpO2 100%  Physical Exam Vitals and nursing note reviewed.  Constitutional:      General: He is not in acute distress.    Appearance: He is not ill-appearing, toxic-appearing or diaphoretic.  HENT:     Head: Normocephalic.  Eyes:     General: No scleral icterus.       Right eye: No discharge.        Left eye: No discharge.  Cardiovascular:     Rate and Rhythm: Normal rate.  Pulmonary:     Effort: Pulmonary effort is normal.  Abdominal:     General: Abdomen is protuberant. Bowel sounds are normal. There is no distension. There are no signs of injury.     Palpations: Abdomen is soft. There is no mass or pulsatile mass.     Tenderness: There is abdominal tenderness in the right upper quadrant. There is no right CVA tenderness, left CVA tenderness, guarding or rebound. Negative signs include Murphy's sign.     Hernia: There is no hernia in the umbilical area or ventral area.  Skin:    General: Skin is warm and dry.  Neurological:  General: No focal deficit present.     Mental Status: He is alert.  Psychiatric:        Behavior: Behavior is cooperative.     ED Results / Procedures / Treatments   Labs (all labs ordered are listed, but only abnormal results are displayed) Labs Reviewed  COMPREHENSIVE METABOLIC PANEL - Abnormal; Notable for the following components:      Result Value   Glucose, Bld 118 (*)    All other components within normal limits  CBC - Abnormal; Notable for the following components:   Hemoglobin 17.5 (*)    All other components  within normal limits  URINALYSIS, ROUTINE W REFLEX MICROSCOPIC - Abnormal; Notable for the following components:   Color, Urine AMBER (*)    APPearance HAZY (*)    Specific Gravity, Urine 1.034 (*)    Ketones, ur 20 (*)    Protein, ur 100 (*)    All other components within normal limits  LIPASE, BLOOD    EKG None  Radiology US Abdomen Limited RUQ (LIVER/GB)  Result Date: 01/05/2022 CLINICAL DATA:  Right upper quadrant pain and vomiting. EXAM: ULTRASOUND ABDOMEN LIMITED RIGHT UPPER QUADRANT COMPARISON:  None Available. FINDINGS: Gallbladder: No gallstones or wall thickening visualized. No sonographic Murphy sign noted by sonographer. Common bile duct: Diameter: 3 mm, within normal limits. Liver: No focal lesion identified. Within normal limits in parenchymal echogenicity. Portal vein is patent on color Doppler imaging with normal direction of blood flow towards the liver. Other: None. IMPRESSION: Negative. No hepatobiliary abnormality identified. Electronically Signed   By: Marlaine Hind M.D.   On: 01/05/2022 13:41    Procedures Procedures    Medications Ordered in ED Medications  ondansetron (ZOFRAN-ODT) disintegrating tablet 4 mg (4 mg Oral Given 01/05/22 1141)  alum & mag hydroxide-simeth (MAALOX/MYLANTA) 200-200-20 MG/5ML suspension 30 mL (30 mLs Oral Given 01/05/22 1413)    And  lidocaine (XYLOCAINE) 2 % viscous mouth solution 15 mL (15 mLs Oral Given 01/05/22 1413)    ED Course/ Medical Decision Making/ A&P                           Medical Decision Making Amount and/or Complexity of Data Reviewed Labs: ordered. Radiology: ordered.  Risk OTC drugs. Prescription drug management.   Alert 26 year old male in no acute distress, nontoxic-appearing.  Presents to the emergency department with a chief complaint of abdominal pain, nausea, and vomiting.  Information is obtained from patient.  Past medical records were reviewed including previous provider notes, labs, and  imaging.  With reports of nausea, vomiting, and tenderness to right upper quadrant concern for possible cholelithiasis, choledocholithiasis, cholecystitis, acute pancreatitis.    Labs were obtained while patient was in triage.  I personally viewed interpret patient's lab results.  Pertinent findings include: -Lipase within normal limits -CMP, CBC unremarkable -UA shows increased Pacific gravity as well as ketones present.  Suspect this is due to patient's decreased p.o. intake over the last 3 days.  With tenderness to right upper quadrant will obtain right upper quadrant ultrasound for further evaluation.  Patient given Zofran for nausea and vomiting.  I personally viewed and interpreted patient's ultrasound imaging.  Agree with radiology interpretation of no hepatobiliary abnormality.  Patient able to tolerate p.o. intake without difficulty.  On serial reexamination abdomen remains soft, nondistended and minimally tender.  Suspect that patient's pain may be due to alcoholic gastritis.  Will prescribe patient with Protonix, Carafate, and Zofran.  Patient to follow-up with gastroenterology in the outpatient setting.  Based on patient's chief complaint, I considered admission might be necessary, however after reassuring ED workup feel patient is reasonable for discharge.  Discussed results, findings, treatment and follow up. Patient advised of return precautions. Patient verbalized understanding and agreed with plan.  Portions of this note were generated with Scientist, clinical (histocompatibility and immunogenetics). Dictation errors may occur despite best attempts at proofreading.         Final Clinical Impression(s) / ED Diagnoses Final diagnoses:  Epigastric pain  Nausea and vomiting, unspecified vomiting type  Hypertension, unspecified type    Rx / DC Orders ED Discharge Orders          Ordered    pantoprazole (PROTONIX) 20 MG tablet  Daily        01/05/22 1420    sucralfate (CARAFATE) 1 g tablet  3 times  daily with meals & bedtime        01/05/22 1420    ondansetron (ZOFRAN-ODT) 4 MG disintegrating tablet  Every 8 hours PRN        01/05/22 1420              Haskel Schroeder, PA-C 01/05/22 1614    Arby Barrette, MD 01/18/22 1140

## 2022-01-05 NOTE — ED Notes (Signed)
Pt transported to Ultrasound.  

## 2022-01-05 NOTE — ED Triage Notes (Signed)
Patient complains of general abdominal pain with vomiting since Thursday. Patient states that he drinks 3-4 days a week, denies diarrhea

## 2023-03-25 IMAGING — US US ABDOMEN LIMITED
1 series · 14 of 25 positions shown · non-contrast
Comparison: None Available.

CLINICAL DATA: Right upper quadrant pain and vomiting.

EXAM:
ULTRASOUND ABDOMEN LIMITED RIGHT UPPER QUADRANT

[Series 1: us abdomen limited ruq (liver/gb) · 14 of 59 slices shown]
[im 1/59]
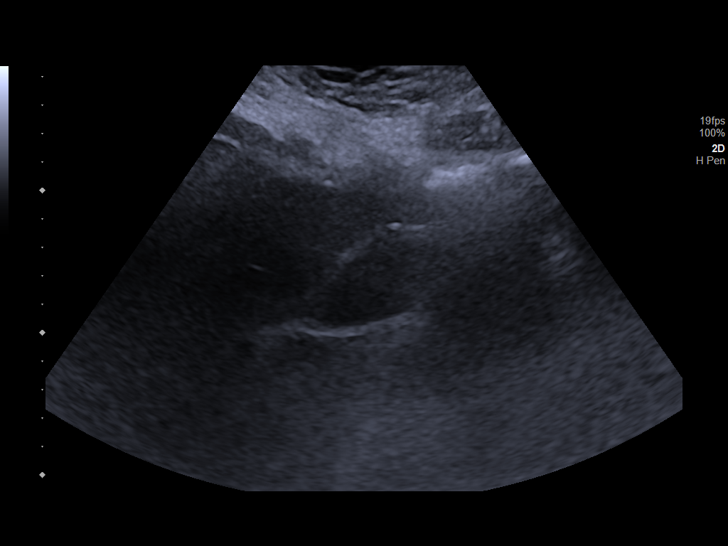
[im 5/59]
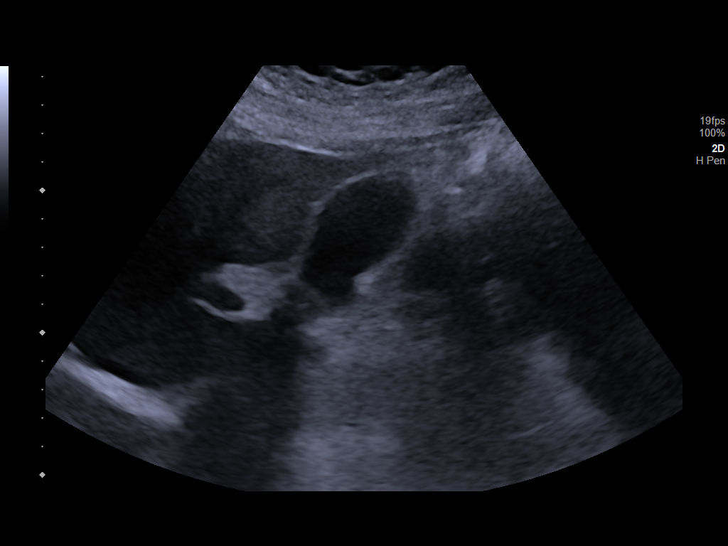
[im 10/59]
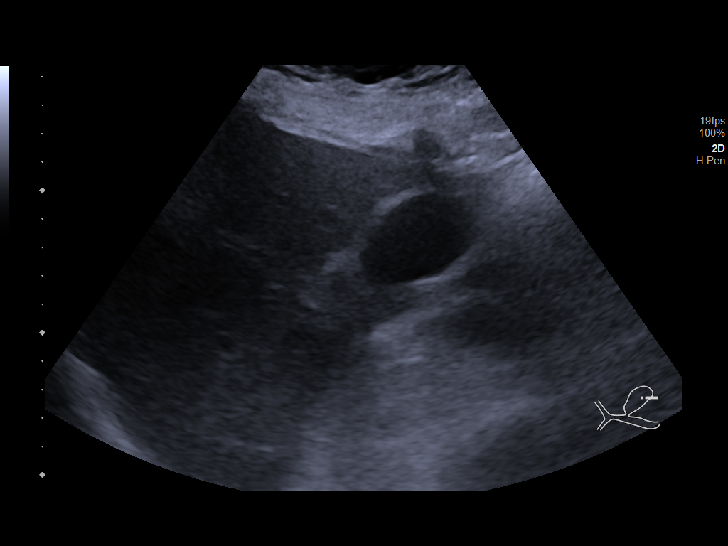
[im 15/59]
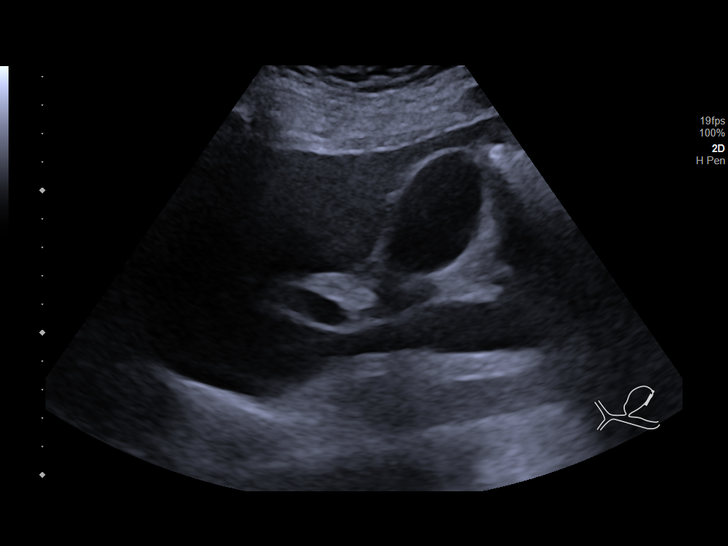
[im 20/59]
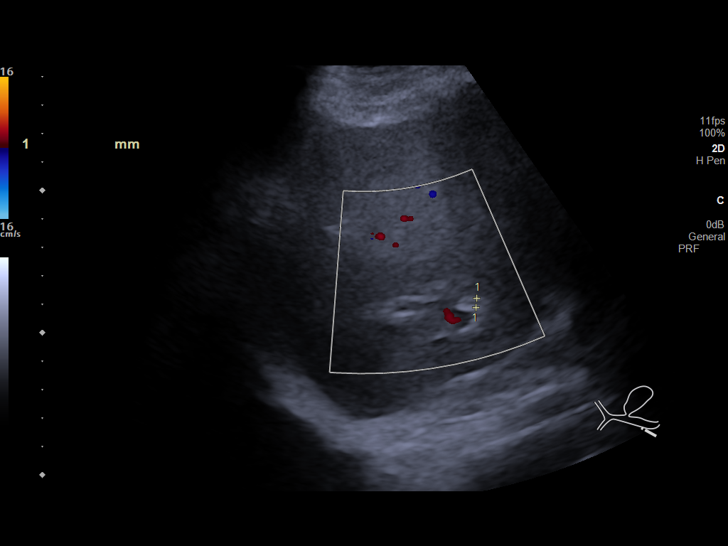
[im 22/59]
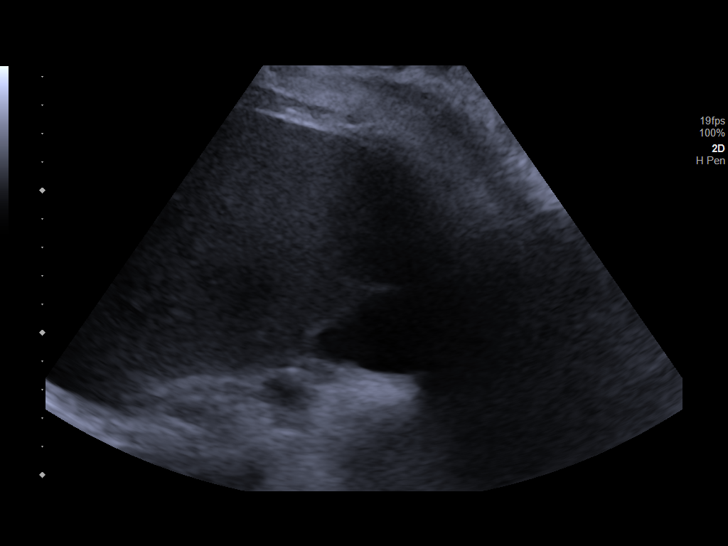
[im 27/59]
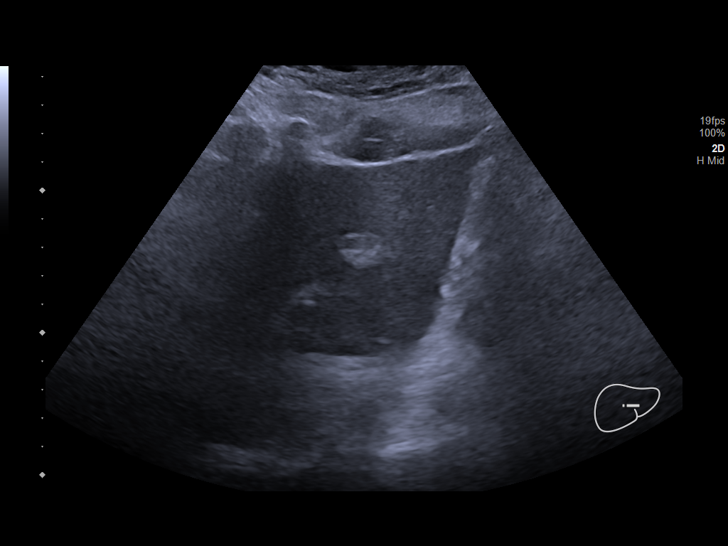
[im 32/59]
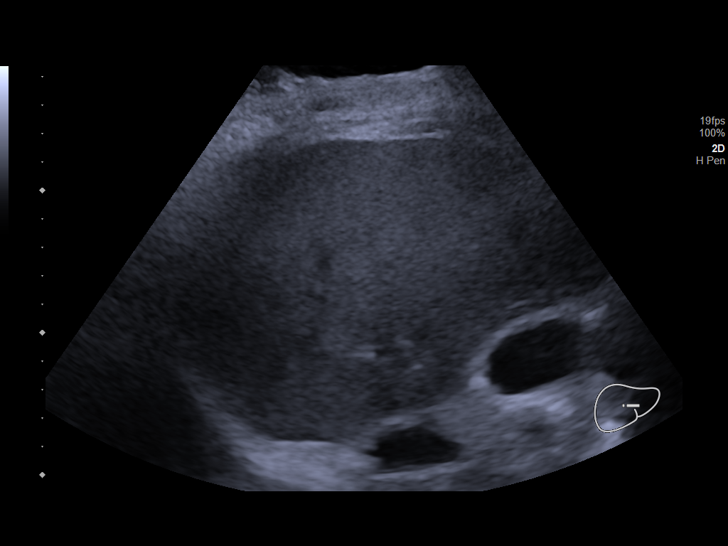
[im 37/59]
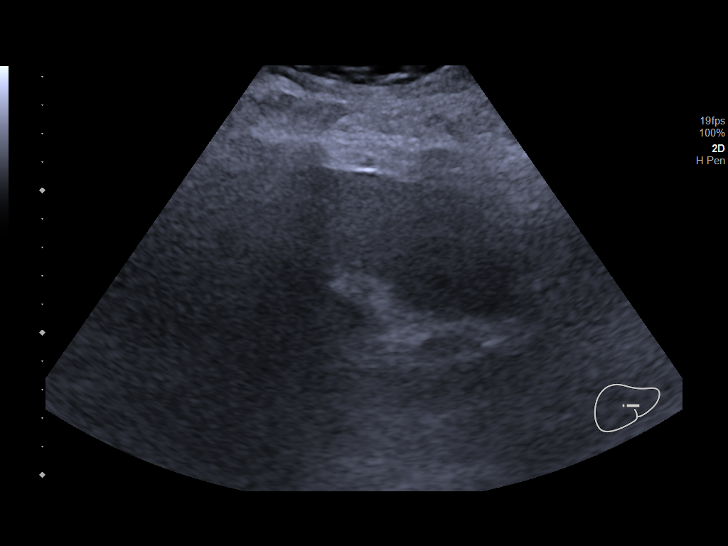
[im 39/59]
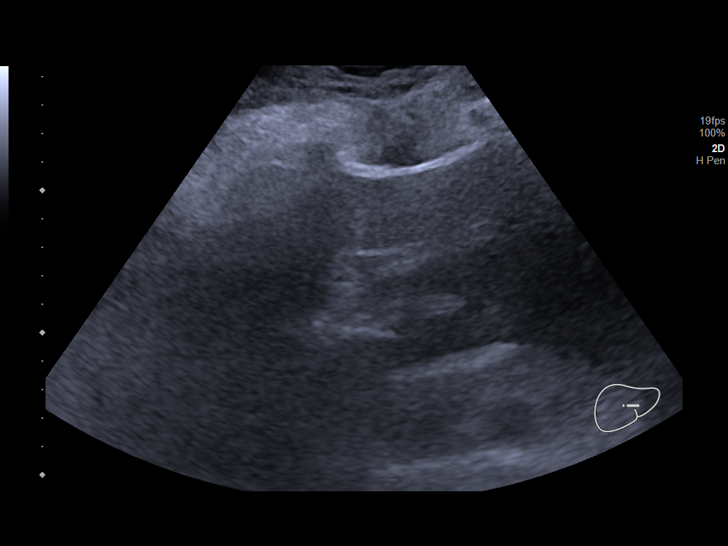
[im 44/59]
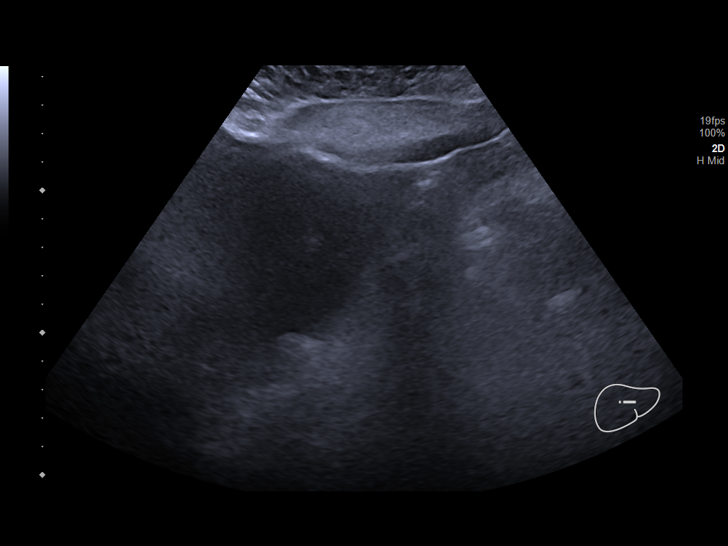
[im 49/59]
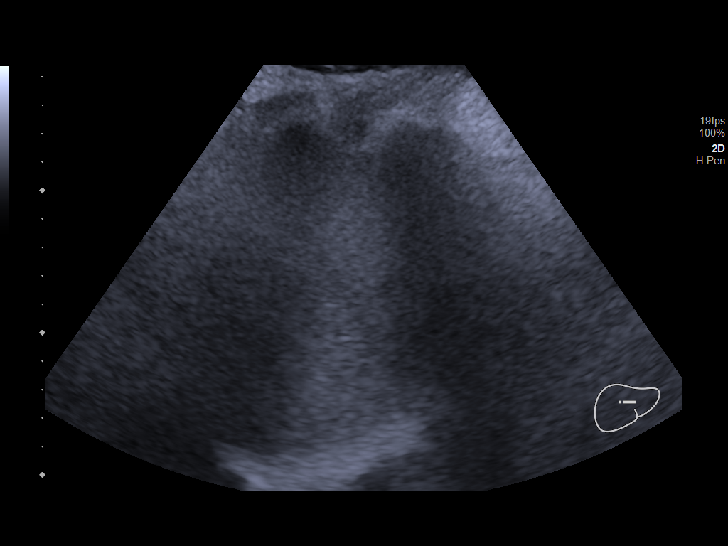
[im 54/59]
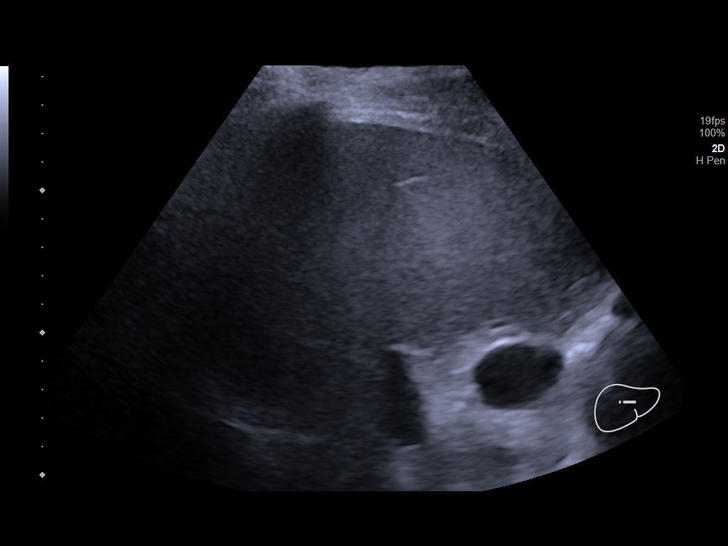
[im 59/59]
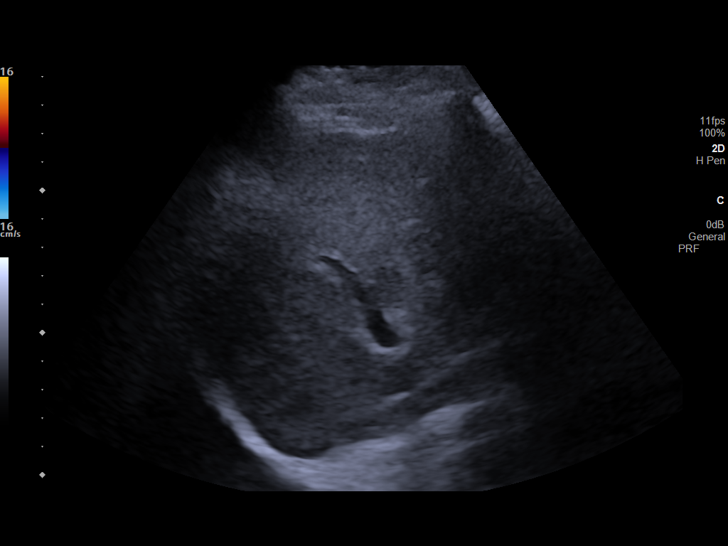

[14 of 25 positions shown; findings below may reference images not displayed]

FINDINGS: Gallbladder:

No gallstones or wall thickening visualized. No sonographic Murphy
sign noted by sonographer.

Common bile duct:

Diameter: 3 mm, within normal limits.

Liver:

No focal lesion identified. Within normal limits in parenchymal
echogenicity. Portal vein is patent on color Doppler imaging with
normal direction of blood flow towards the liver.

Other: None.
IMPRESSION: Negative. No hepatobiliary abnormality identified.

## 2023-04-24 ENCOUNTER — Emergency Department (HOSPITAL_COMMUNITY)
Admission: EM | Admit: 2023-04-24 | Discharge: 2023-04-24 | Disposition: A | Discharge status: Home / Self Care | Payer: Self-pay | Attending: Emergency Medicine | Admitting: Emergency Medicine

## 2023-04-24 ENCOUNTER — Other Ambulatory Visit: Payer: Self-pay

## 2023-04-24 ENCOUNTER — Encounter (HOSPITAL_COMMUNITY): Payer: Self-pay | Admitting: *Deleted

## 2023-04-24 DIAGNOSIS — R112 Nausea with vomiting, unspecified: Secondary | ICD-10-CM | POA: Insufficient documentation

## 2023-04-24 DIAGNOSIS — R109 Unspecified abdominal pain: Secondary | ICD-10-CM | POA: Insufficient documentation

## 2023-04-24 LAB — COMPREHENSIVE METABOLIC PANEL
ALT: 24 U/L (ref 0–44)
AST: 20 U/L (ref 15–41)
Albumin: 4.1 g/dL (ref 3.5–5.0)
Alkaline Phosphatase: 62 U/L (ref 38–126)
Anion gap: 8 (ref 5–15)
BUN: 14 mg/dL (ref 6–20)
CO2: 21 mmol/L — ABNORMAL LOW (ref 22–32)
Calcium: 8.5 mg/dL — ABNORMAL LOW (ref 8.9–10.3)
Chloride: 107 mmol/L (ref 98–111)
Creatinine, Ser: 0.89 mg/dL (ref 0.61–1.24)
GFR, Estimated: >60 mL/min (ref >60)
Glucose, Bld: 132 mg/dL — ABNORMAL HIGH (ref 70–99)
Potassium: 4 mmol/L (ref 3.5–5.1)
Sodium: 136 mmol/L (ref 135–145)
Total Bilirubin: 0.3 mg/dL (ref 0.3–1.2)
Total Protein: 7.4 g/dL (ref 6.5–8.1)

## 2023-04-24 LAB — URINALYSIS, ROUTINE W REFLEX MICROSCOPIC
Bilirubin Urine: NEGATIVE
Glucose, UA: NEGATIVE mg/dL
Hgb urine dipstick: NEGATIVE
Ketones, ur: NEGATIVE mg/dL
Leukocytes,Ua: NEGATIVE
Nitrite: NEGATIVE
Protein, ur: NEGATIVE mg/dL
Specific Gravity, Urine: 1.025 (ref 1.005–1.030)
pH: 6 (ref 5.0–8.0)

## 2023-04-24 LAB — CBC
HCT: 52.1 % — ABNORMAL HIGH (ref 39.0–52.0)
Hemoglobin: 17.5 g/dL — ABNORMAL HIGH (ref 13.0–17.0)
MCH: 32.5 pg (ref 26.0–34.0)
MCHC: 33.6 g/dL (ref 30.0–36.0)
MCV: 96.8 fL (ref 80.0–100.0)
Platelets: 277 10*3/uL (ref 150–400)
RBC: 5.38 MIL/uL (ref 4.22–5.81)
RDW: 12 % (ref 11.5–15.5)
WBC: 6.1 10*3/uL (ref 4.0–10.5)
nRBC: 0 % (ref 0.0–0.2)

## 2023-04-24 LAB — LIPASE, BLOOD: Lipase: 37 U/L (ref 11–51)

## 2023-04-24 MED ORDER — ONDANSETRON HCL 4 MG PO TABS: 4.0000 mg | ORAL_TABLET | Freq: Four times a day (QID) | ORAL | 0 refills | Status: AC

## 2023-04-24 NOTE — ED Provider Notes (Signed)
[place] EMERGENCY DEPARTMENT AT [facility] Provider Note   CSN: [id number] Arrival date & time: 04/24/23  1406     History  Chief Complaint  Patient presents with   Abdominal Pain    Eric Haney is a 27 y.o. male.  Patient is a 27 year old male with a past medical history of alcohol use presenting to the emergency department with nausea and vomiting.  Patient states that he has had nausea and vomiting for the last 24 hours.  He initially reported abdominal pain to triage but denies abdominal pain to myself.  He states that he was drinking alcohol before his symptoms started and that he occasionally gets the symptoms after drinking.  He states that he drinks about 3 times a week and has never had alcohol withdrawal or withdrawal seizures in the past.  He denies any other substance use.  He denies any fevers, diarrhea or constipation, dysuria or hematuria.  The history is provided by the patient.  Abdominal Pain      Home Medications Prior to Admission medications   Medication Sig Start Date End Date Taking? Authorizing Provider  ondansetron (ZOFRAN) 4 MG tablet Take 1 tablet (4 mg total) by mouth every 6 (six) hours. 04/24/23  Yes Theresia Lo, Elmin Wiederholt K, DO  ondansetron (ZOFRAN-ODT) 4 MG disintegrating tablet Take 1 tablet (4 mg total) by mouth every 8 (eight) hours as needed for nausea or vomiting. 01/05/22   Haskel Schroeder, PA-C  pantoprazole (PROTONIX) 20 MG tablet Take 1 tablet (20 mg total) by mouth daily. 01/05/22 02/04/22  Haskel Schroeder, PA-C  sucralfate (CARAFATE) 1 g tablet Take 1 tablet (1 g total) by mouth 4 (four) times daily -  with meals and at bedtime. 01/05/22 02/04/22  Haskel Schroeder, PA-C      Allergies    Patient has no known allergies.    Review of Systems   Review of Systems  Gastrointestinal:  Positive for abdominal pain.    Physical Exam Updated Vital Signs BP (!) 163/96 (BP Location: Right Arm)   Pulse 91   Temp 97.9 F  (36.6 C) (Oral)   Resp 20   Ht 5\' 10"  (1.778 m)   Wt 124.7 kg   SpO2 98%   BMI 39.46 kg/m  Physical Exam Vitals and nursing note reviewed.  Constitutional:      General: He is not in acute distress.    Appearance: He is well-developed.  HENT:     Head: Normocephalic and atraumatic.     Mouth/Throat:     Mouth: Mucous membranes are moist.     Pharynx: Oropharynx is clear.  Eyes:     Extraocular Movements: Extraocular movements intact.  Cardiovascular:     Rate and Rhythm: Normal rate and regular rhythm.  Pulmonary:     Effort: Pulmonary effort is normal.  Abdominal:     General: Abdomen is flat.     Palpations: Abdomen is soft.     Tenderness: There is no abdominal tenderness.  Skin:    General: Skin is warm and dry.  Neurological:     General: No focal deficit present.     Mental Status: He is alert and oriented to person, place, and time.  Psychiatric:        Mood and Affect: Mood normal.        Behavior: Behavior normal.     ED Results / Procedures / Treatments   Labs (all labs ordered are listed, but only abnormal results are  displayed) Labs Reviewed  COMPREHENSIVE METABOLIC PANEL - Abnormal; Notable for the following components:      Result Value   CO2 21 (*)    Glucose, Bld 132 (*)    Calcium 8.5 (*)    All other components within normal limits  CBC - Abnormal; Notable for the following components:   Hemoglobin 17.5 (*)    HCT 52.1 (*)    All other components within normal limits  LIPASE, BLOOD  URINALYSIS, ROUTINE W REFLEX MICROSCOPIC    EKG None  Radiology No results found.  Procedures Procedures    Medications Ordered in ED Medications - No data to display  ED Course/ Medical Decision Making/ A&P Clinical Course as of 04/24/23 1644  Thu Apr 24, 2023  1643 UA negative. Patient has been able to tolerate PO without recurrence of symptoms. He's stable for discharge home with outpatient follow up. [VK]    Clinical Course User Index [VK]  Rexford Maus, DO                                 Medical Decision Making This patient presents to the ED with chief complaint(s) of nausea, vomiting with pertinent past medical history of alcohol use which further complicates the presenting complaint. The complaint involves an extensive differential diagnosis and also carries with it a high risk of complications and morbidity.    The differential diagnosis includes gastritis, GERD, pancreatitis, hepatitis, dehydration, electrolyte abnormality  Additional history obtained: Additional history obtained from N/A Records reviewed previous ED records  ED Course and Reassessment: On patient's arrival he is hemodynamically stable in no acute distress.  He was initially evaluated by triage and had labs performed.  Patient's labs are within normal range.  On my evaluation.  Patient reports his nausea has completely resolved and he is feeling hungry.  The patient will be given a p.o. trial and will be closely reassessed.  Independent labs interpretation:  The following labs were independently interpreted: Within normal range  Independent visualization of imaging: - N/A  Consultation: - Consulted or discussed management/test interpretation w/ external professional: N/A  Consideration for admission or further workup: Patient has no emergent conditions requiring admission or further work-up at this time and is stable for discharge home with primary care follow-up  Social Determinants of health: ETOH use    Amount and/or Complexity of Data Reviewed Labs: ordered.  Risk Prescription drug management.          Final Clinical Impression(s) / ED Diagnoses Final diagnoses:  Nausea and vomiting, unspecified vomiting type    Rx / DC Orders ED Discharge Orders          Ordered    ondansetron (ZOFRAN) 4 MG tablet  Every 6 hours        04/24/23 1600              Elayne Snare K, DO 04/24/23 1644

## 2023-04-24 NOTE — ED Triage Notes (Signed)
Vomiting since last night, states he can not keep anything down

## 2023-04-24 NOTE — Discharge Instructions (Addendum)
You were seen in the emergency department for your nausea and vomiting.  Your workup showed no signs of dehydration or abnormalities.  The injury of the kidneys.  This might be related to your alcohol use.  I would recommend slowly cutting back your alcohol until you completely stop to prevent any alcohol withdrawal symptoms and to prevent recurrence of the symptoms.  You can take Zofran as needed for nausea and you can use over-the-counter antacid such as Pepcid or Maalox as needed for any pain.  You should follow-up with your primary doctor in the next few days to have your symptoms rechecked.  You should return to the emergency department if you have any recurrent vomiting despite your nausea medicine, significantly worsening pain, fevers or any other new or concerning symptoms.
